# Patient Record
Sex: Male | Born: 1995 | Race: White | Marital: Single | Smoking: Former smoker
Health system: Southern US, Community
[De-identification: ages and names within clinical notes are randomized; demographics above are authoritative.]

---

## 2015-10-18 ENCOUNTER — Encounter: Payer: Self-pay | Admitting: *Deleted

## 2015-10-18 ENCOUNTER — Emergency Department
Admission: EM | Admit: 2015-10-18 | Discharge: 2015-10-18 | Disposition: A | Discharge status: Home / Self Care | Payer: Self-pay | Attending: Emergency Medicine | Admitting: Emergency Medicine

## 2015-10-18 ENCOUNTER — Emergency Department: Payer: Self-pay

## 2015-10-18 DIAGNOSIS — Y92322 Soccer field as the place of occurrence of the external cause: Secondary | ICD-10-CM | POA: Insufficient documentation

## 2015-10-18 DIAGNOSIS — S92351A Displaced fracture of fifth metatarsal bone, right foot, initial encounter for closed fracture: Secondary | ICD-10-CM | POA: Insufficient documentation

## 2015-10-18 DIAGNOSIS — W2102XA Struck by soccer ball, initial encounter: Secondary | ICD-10-CM | POA: Insufficient documentation

## 2015-10-18 DIAGNOSIS — Y9366 Activity, soccer: Secondary | ICD-10-CM | POA: Insufficient documentation

## 2015-10-18 DIAGNOSIS — Y998 Other external cause status: Secondary | ICD-10-CM | POA: Insufficient documentation

## 2015-10-18 DIAGNOSIS — S9031XA Contusion of right foot, initial encounter: Secondary | ICD-10-CM | POA: Insufficient documentation

## 2015-10-18 DIAGNOSIS — S92301A Fracture of unspecified metatarsal bone(s), right foot, initial encounter for closed fracture: Secondary | ICD-10-CM

## 2015-10-18 MED ORDER — TRAMADOL HCL 50 MG PO TABS: 50.0000 mg | ORAL_TABLET | Freq: Four times a day (QID) | ORAL | Status: AC | PRN

## 2015-10-18 MED ORDER — NAPROXEN 500 MG PO TABS: 500.0000 mg | ORAL_TABLET | Freq: Two times a day (BID) | ORAL | Status: AC

## 2015-10-18 NOTE — Discharge Instructions (Signed)
Metatarsal Fracture °A metatarsal fracture is a break in a metatarsal bone. Metatarsal bones connect your toe bones to your ankle bones. °CAUSES °This type of fracture may be caused by: °· A sudden twisting of your foot. °· A fall onto your foot. °· Overuse or repetitive exercise. °RISK FACTORS °This condition is more likely to develop in people who: °· Play contact sports. °· Have a bone disease. °· Have a low calcium level. °SYMPTOMS °Symptoms of this condition include: °· Pain that is worse when walking or standing. °· Pain when pressing on the foot or moving the toes. °· Swelling. °· Bruising on the top or bottom of the foot. °· A foot that appears shorter than the other one. °DIAGNOSIS °This condition is diagnosed with a physical exam. You may also have imaging tests, such as: °· X-rays. °· A CT scan. °· MRI. °TREATMENT °Treatment for this condition depends on its severity and whether a bone has moved out of place. Treatment may involve: °· Rest. °· Wearing foot support such as a cast, splint, or boot for several weeks. °· Using crutches. °· Surgery to move bones back into the right position. Surgery is usually needed if there are many pieces of broken bone or bones that are very out of place (displaced fracture). °· Physical therapy. This may be needed to help you regain full movement and strength in your foot. °You will need to return to your health care provider to have X-rays taken until your bones heal. Your health care provider will look at the X-rays to make sure that your foot is healing well. °HOME CARE INSTRUCTIONS  °If You Have a Cast: °· Do not stick anything inside the cast to scratch your skin. Doing that increases your risk of infection. °· Check the skin around the cast every day. Report any concerns to your health care provider. You may put lotion on dry skin around the edges of the cast. Do not apply lotion to the skin underneath the cast. °· Keep the cast clean and dry. °If You Have a Splint  or a Supportive Boot: °· Wear it as directed by your health care provider. Remove it only as directed by your health care provider. °· Loosen it if your toes become numb and tingle, or if they turn cold and blue. °· Keep it clean and dry. °Bathing °· Do not take baths, swim, or use a hot tub until your health care provider approves. Ask your health care provider if you can take showers. You may only be allowed to take sponge baths for bathing. °· If your health care provider approves bathing and showering, cover the cast or splint with a watertight plastic bag to protect it from water. Do not let the cast or splint get wet. °Managing Pain, Stiffness, and Swelling °· If directed, apply ice to the injured area (if you have a splint, not a cast). °¨ Put ice in a plastic bag. °¨ Place a towel between your skin and the bag. °¨ Leave the ice on for 20 minutes, 2-3 times per day. °· Move your toes often to avoid stiffness and to lessen swelling. °· Raise (elevate) the injured area above the level of your heart while you are sitting or lying down. °Driving °· Do not drive or operate heavy machinery while taking pain medicine. °· Do not drive while wearing foot support on a foot that you use for driving. °Activity °· Return to your normal activities as directed by your health care   provider. Ask your health care provider what activities are safe for you. °· Perform exercises as directed by your health care provider or physical therapist. °Safety °· Do not use the injured foot to support your body weight until your health care provider says that you can. Use crutches as directed by your health care provider. °General Instructions °· Do not put pressure on any part of the cast or splint until it is fully hardened. This may take several hours. °· Do not use any tobacco products, including cigarettes, chewing tobacco, or e-cigarettes. Tobacco can delay bone healing. If you need help quitting, ask your health care  provider. °· Take medicines only as directed by your health care provider. °· Keep all follow-up visits as directed by your health care provider. This is important. °SEEK MEDICAL CARE IF: °· You have a fever. °· Your cast, splint, or boot is too loose or too tight. °· Your cast, splint, or boot is damaged. °· Your pain medicine is not helping. °· You have pain, tingling, or numbness in your foot that is not going away. °SEEK IMMEDIATE MEDICAL CARE IF: °· You have severe pain. °· You have tingling or numbness in your foot that is getting worse. °· Your foot feels cold or becomes numb. °· Your foot changes color. °  °This information is not intended to replace advice given to you by your health care provider. Make sure you discuss any questions you have with your health care provider. °  °Document Released: 09/03/2002 Document Revised: 04/28/2015 Document Reviewed: 10/08/2014 °Elsevier Interactive Patient Education ©2016 [employer] ° °Cast or Splint Care °Casts and splints support injured limbs and keep bones from moving while they heal. It is important to care for your cast or splint at home.   °HOME CARE INSTRUCTIONS °· Keep the cast or splint uncovered during the drying period. It can take 24 to 48 hours to dry if it is made of plaster. A fiberglass cast will dry in less than 1 hour. °· Do not rest the cast on anything harder than a pillow for the first 24 hours. °· Do not put weight on your injured limb or apply pressure to the cast until your health care provider gives you permission. °· Keep the cast or splint dry. Wet casts or splints can lose their shape and may not support the limb as well. A wet cast that has lost its shape can also create harmful pressure on your skin when it dries. Also, wet skin can become infected. °¨ Cover the cast or splint with a plastic bag when bathing or when out in the rain or snow. If the cast is on the trunk of the body, take sponge baths until the cast is removed. °¨ If your  cast does become wet, dry it with a towel or a blow dryer on the cool setting only. °· Keep your cast or splint clean. Soiled casts may be wiped with a moistened cloth. °· Do not place any hard or soft foreign objects under your cast or splint, such as cotton, toilet paper, lotion, or powder. °· Do not try to scratch the skin under the cast with any object. The object could get stuck inside the cast. Also, scratching could lead to an infection. If itching is a problem, use a blow dryer on a cool setting to relieve discomfort. °· Do not trim or cut your cast or remove padding from inside of it. °· Exercise all joints next to the injury that are   not immobilized by the cast or splint. For example, if you have a long leg cast, exercise the hip joint and toes. If you have an arm cast or splint, exercise the shoulder, elbow, thumb, and fingers. °· Elevate your injured arm or leg on 1 or 2 pillows for the first 1 to 3 days to decrease swelling and pain. It is best if you can comfortably elevate your cast so it is higher than your heart. °SEEK MEDICAL CARE IF:  °· Your cast or splint cracks. °· Your cast or splint is too tight or too loose. °· You have unbearable itching inside the cast. °· Your cast becomes wet or develops a soft spot or area. °· You have a bad smell coming from inside your cast. °· You get an object stuck under your cast. °· Your skin around the cast becomes red or raw. °· You have new pain or worsening pain after the cast has been applied. °SEEK IMMEDIATE MEDICAL CARE IF:  °· You have fluid leaking through the cast. °· You are unable to move your fingers or toes. °· You have discolored (blue or white), cool, painful, or very swollen fingers or toes beyond the cast. °· You have tingling or numbness around the injured area. °· You have severe pain or pressure under the cast. °· You have any difficulty with your breathing or have shortness of breath. °· You have chest pain. °  °This information is not  intended to replace advice given to you by your health care provider. Make sure you discuss any questions you have with your health care provider. °  °Document Released: 12/09/2000 Document Revised: 10/02/2013 Document Reviewed: 06/20/2013 °Elsevier Interactive Patient Education ©2016 [employer] ° °

## 2015-10-18 NOTE — ED Provider Notes (Signed)
[facility] Emergency Department Provider Note  ____________________________________________  Time seen: Approximately 4:17 PM  I have reviewed the triage vital signs and the nursing notes.   HISTORY  Chief Complaint Foot Injury    HPI Johnny Mayer is a 19 y.o. male patient complaining of right foot pain secondary to being kicked in the foot playing soccer. Instead occurred 2 days ago. Patient continue having pain to the lateral aspect of his right foot. Patient is noticed bruises last 24 hours. Patient is rating his pain as a 10 over 10 states increase with weightbearing. Palliative measures consist only of ice and Motrin.   History reviewed. No pertinent past medical history.  There are no active problems to display for this patient.   History reviewed. No pertinent past surgical history.  No current outpatient prescriptions on file.  Allergies Review of patient's allergies indicates no known allergies.  No family history on file.  Social History Social History  Substance Use Topics  . Smoking status: Never Smoker   . Smokeless tobacco: None  . Alcohol Use: Yes    Review of Systems Constitutional: No fever/chills Eyes: No visual changes. ENT: No sore throat. Cardiovascular: Denies chest pain. Respiratory: Denies shortness of breath. Gastrointestinal: No abdominal pain.  No nausea, no vomiting.  No diarrhea.  No constipation. Genitourinary: Negative for dysuria. Musculoskeletal: Right lateral foot pain Skin: Negative for rash. Ecchymosis right lateral foot Neurological: Negative for headaches, focal weakness or numbness. 10-point ROS otherwise negative.  ____________________________________________   PHYSICAL EXAM:  VITAL SIGNS: ED Triage Vitals  Enc Vitals Group     BP 10/18/15 1612 114/6 mmHg     Pulse Rate 10/18/15 1612 50     Resp 10/18/15 1612 16     Temp 10/18/15 1612 97.6 F (36.4 C)     Temp Source 10/18/15 1612  Oral     SpO2 10/18/15 1612 98 %     Weight 10/18/15 1612 165 lb (74.844 kg)     Height 10/18/15 1612 6\' 2"  (1.88 m)     Head Cir --      Peak Flow --      Pain Score 10/18/15 1605 10     Pain Loc --      Pain Edu? --      Excl. in GC? --     Constitutional: Alert and oriented. Well appearing and in no acute distress. Eyes: Conjunctivae are normal. PERRL. EOMI. Head: Atraumatic. Nose: No congestion/rhinnorhea. Mouth/Throat: Mucous membranes are moist.  Oropharynx non-erythematous. Neck: No stridor.  No cervical spine tenderness to palpation. Hematological/Lymphatic/Immunilogical: No cervical lymphadenopathy. Cardiovascular: Normal rate, regular rhythm. Grossly normal heart sounds.  Good peripheral circulation. Respiratory: Normal respiratory effort.  No retractions. Lungs CTAB. Gastrointestinal: Soft and nontender. No distention. No abdominal bruits. No CVA tenderness. Musculoskeletal: No obvious deformity. Mild edema and ecchymosis lateral fifth metatarsal. Free nuchal range of motion nonweightbearing. Patient has atypical gait with walking favoring the injured lower extremity.  Neurologic:  Normal speech and language. No gross focal neurologic deficits are appreciated. No gait instability. Skin:  Skin is warm, dry and intact. No rash noted. Psychiatric: Mood and affect are normal. Speech and behavior are normal.  ____________________________________________   LABS (all labs ordered are listed, but only abnormal results are displayed)  Labs Reviewed - No data to display ____________________________________________  EKG   ____________________________________________  RADIOLOGY  X-rays reveal mild displaced fracture of the base of the fifth metatarsal.   I, Joni Reiningonald K Joane Postel, personally  viewed and evaluated these images (plain radiographs) as part of my medical decision making.   ____________________________________________   PROCEDURES  Procedure(s) performed:  None  Critical Care performed: No  ____________________________________________   INITIAL IMPRESSION / ASSESSMENT AND PLAN / ED COURSE  Pertinent labs & imaging results that were available during my care of the patient were reviewed by me and considered in my medical decision making (see chart for details).  Fifth metatarsal fracture right foot. Skin is x-ray findings with patient. Patient placed in a walking shoe and advised to follow orthopedics. Patient given a prescription for naproxen and tramadol. ____________________________________________   FINAL CLINICAL IMPRESSION(S) / ED DIAGNOSES  Final diagnoses:  Fracture of fifth metatarsal bone, right, closed, initial encounter      Joni Reining, PA-C 10/18/15 1704  Darien Ramus, MD 10/18/15 [phone]

## 2015-10-18 NOTE — ED Notes (Signed)
Pt was playing soccer and he was kicked in his right foot.

## 2017-03-25 ENCOUNTER — Emergency Department
Admission: EM | Admit: 2017-03-25 | Discharge: 2017-03-25 | Disposition: A | Discharge status: Home / Self Care | Payer: Managed Care, Other (non HMO) | Attending: Emergency Medicine | Admitting: Emergency Medicine

## 2017-03-25 ENCOUNTER — Emergency Department: Payer: Managed Care, Other (non HMO)

## 2017-03-25 ENCOUNTER — Encounter: Payer: Self-pay | Admitting: Emergency Medicine

## 2017-03-25 DIAGNOSIS — Y939 Activity, unspecified: Secondary | ICD-10-CM | POA: Insufficient documentation

## 2017-03-25 DIAGNOSIS — W010XXA Fall on same level from slipping, tripping and stumbling without subsequent striking against object, initial encounter: Secondary | ICD-10-CM | POA: Insufficient documentation

## 2017-03-25 DIAGNOSIS — S52502A Unspecified fracture of the lower end of left radius, initial encounter for closed fracture: Secondary | ICD-10-CM | POA: Insufficient documentation

## 2017-03-25 DIAGNOSIS — Y999 Unspecified external cause status: Secondary | ICD-10-CM | POA: Diagnosis not present

## 2017-03-25 DIAGNOSIS — Z87891 Personal history of nicotine dependence: Secondary | ICD-10-CM | POA: Insufficient documentation

## 2017-03-25 DIAGNOSIS — Y929 Unspecified place or not applicable: Secondary | ICD-10-CM | POA: Diagnosis not present

## 2017-03-25 DIAGNOSIS — S6992XA Unspecified injury of left wrist, hand and finger(s), initial encounter: Secondary | ICD-10-CM | POA: Diagnosis present

## 2017-03-25 DIAGNOSIS — S62102A Fracture of unspecified carpal bone, left wrist, initial encounter for closed fracture: Secondary | ICD-10-CM

## 2017-03-25 MED ORDER — OXYCODONE-ACETAMINOPHEN 5-325 MG PO TABS: 1.0000 | ORAL_TABLET | ORAL | 0 refills | Status: AC | PRN

## 2017-03-25 MED ORDER — OXYCODONE-ACETAMINOPHEN 5-325 MG PO TABS
ORAL_TABLET | ORAL | Status: AC
  Administered 2017-03-25: 1 via ORAL
  Filled 2017-03-25: qty 1

## 2017-03-25 MED ORDER — OXYCODONE-ACETAMINOPHEN 5-325 MG PO TABS
1.0000 | ORAL_TABLET | Freq: Once | ORAL | Status: AC
  Administered 2017-03-25: 1 via ORAL

## 2017-03-25 NOTE — Discharge Instructions (Signed)
Please seek medical attention for any high fevers, chest pain, shortness of breath, change in behavior, persistent vomiting, bloody stool or any other new or concerning symptoms.  

## 2017-03-25 NOTE — ED Triage Notes (Signed)
Patient states that he slipped and fell. Patient with pain to left wrist.

## 2017-03-25 NOTE — ED Provider Notes (Signed)
[facility] Emergency Department Provider Note   ____________________________________________   I have reviewed the triage vital signs and the nursing notes.   HISTORY  Chief Complaint Wrist Pain   History limited by: Not Limited   HPI Johnny Mayer is a 21 y.o. male who presents to the emergency department today because of concerns for left wrist pain. Patient states he slipped and fell back onto his buttocks. When it happened he put his hand down. He denies pain anywhere else. He states he feels somewhat decreased sensation in his fingertips. He denies hitting his head.   History reviewed. No pertinent past medical history.  There are no active problems to display for this patient.   History reviewed. No pertinent surgical history.  Prior to Admission medications   Medication Sig Start Date End Date Taking? Authorizing Provider  naproxen (NAPROSYN) 500 MG tablet Take 1 tablet (500 mg total) by mouth 2 (two) times daily with a meal. 10/18/15   Joni Reining, PA-C  traMADol (ULTRAM) 50 MG tablet Take 1 tablet (50 mg total) by mouth every 6 (six) hours as needed for moderate pain. 10/18/15   Joni Reining, PA-C    Allergies Patient has no known allergies.  No family history on file.  Social History Social History  Substance Use Topics  . Smoking status: Former Games developer  . Smokeless tobacco: Never Used  . Alcohol use Yes    Review of Systems  Constitutional: Negative for fever. Cardiovascular: Negative for chest pain. Respiratory: Negative for shortness of breath. Gastrointestinal: Negative for abdominal pain, vomiting and diarrhea. Genitourinary: Negative for dysuria. Musculoskeletal: Positive for left wrist pain. Neurological: Negative for headaches, focal weakness or numbness.  10-point ROS otherwise negative.  ____________________________________________   PHYSICAL EXAM:  VITAL SIGNS: ED Triage Vitals  Enc Vitals Group      BP --      Pulse --      Resp --      Temp --      Temp src --      SpO2 --      Weight 03/25/17 0242 160 lb (72.6 kg)     Height 03/25/17 0242  (1.88 m)     Head Circumference --      Peak Flow --      Pain Score 03/25/17 0240 9   Constitutional: Alert and oriented. Well appearing and in no distress. Eyes: Conjunctivae are normal. Normal extraocular movements. ENT   Head: Normocephalic and atraumatic.   Nose: No congestion/rhinnorhea.   Mouth/Throat: Mucous membranes are moist.   Neck: No stridor. Hematological/Lymphatic/Immunilogical: No cervical lymphadenopathy. Cardiovascular: Normal rate, regular rhythm.  No murmurs, rubs, or gallops.  Respiratory: Normal respiratory effort without tachypnea nor retractions. Breath sounds are clear and equal bilaterally. No wheezes/rales/rhonchi. Genitourinary: Deferred Musculoskeletal: Left wrist without obvious deformity. Tender to palpation over the distal radius and tenderness with grip testing. Sensation intact over all finger pads. Cap refill < 3 secs.  Neurologic:  Normal speech and language. No gross focal neurologic deficits are appreciated.  Skin:  Skin is warm, dry and intact. No rash noted. Psychiatric: Mood and affect are normal. Speech and behavior are normal. Patient exhibits appropriate insight and judgment.  ____________________________________________    LABS (pertinent positives/negatives)  None  ____________________________________________   EKG  None  ____________________________________________    RADIOLOGY  Left wrist IMPRESSION:  Acute nondisplaced distal radial fracture.     I, Phineas Semen, personally viewed and evaluated these images (  plain radiographs) as part of my medical decision making, as well as reviewing the written report by the  radiologist.  ____________________________________________   PROCEDURES  Procedures  ____________________________________________   INITIAL IMPRESSION / ASSESSMENT AND PLAN / ED COURSE  Pertinent labs & imaging results that were available during my care of the patient were reviewed by me and considered in my medical decision making (see chart for details).  Patient presented to the emergency department today with concerns for left wrist pain after a fall. X-rays do show a distal left radial fracture. Patient was placed in a splint. Patient will be given orthopedic follow-up. [place] drug database was checked prior to prescribing opioid pain medication.  ____________________________________________   FINAL CLINICAL IMPRESSION(S) / ED DIAGNOSES  Final diagnoses:  Closed fracture of left wrist, initial encounter  Closed fracture of distal end of left radius, unspecified fracture morphology, initial encounter     Note: This dictation was prepared with Dragon dictation. Any transcriptional errors that result from this process are unintentional     Phineas Semen, MD 03/25/17 [phone]

## 2017-05-22 IMAGING — DX DG WRIST COMPLETE 3+V*L*
4 series · 4 of 4 positions shown · non-contrast
Comparison: None.

CLINICAL DATA: Slip and fall, LEFT wrist pain.

EXAM:
LEFT WRIST - COMPLETE 3+ VIEW

[wrist ap (1 of 2)]
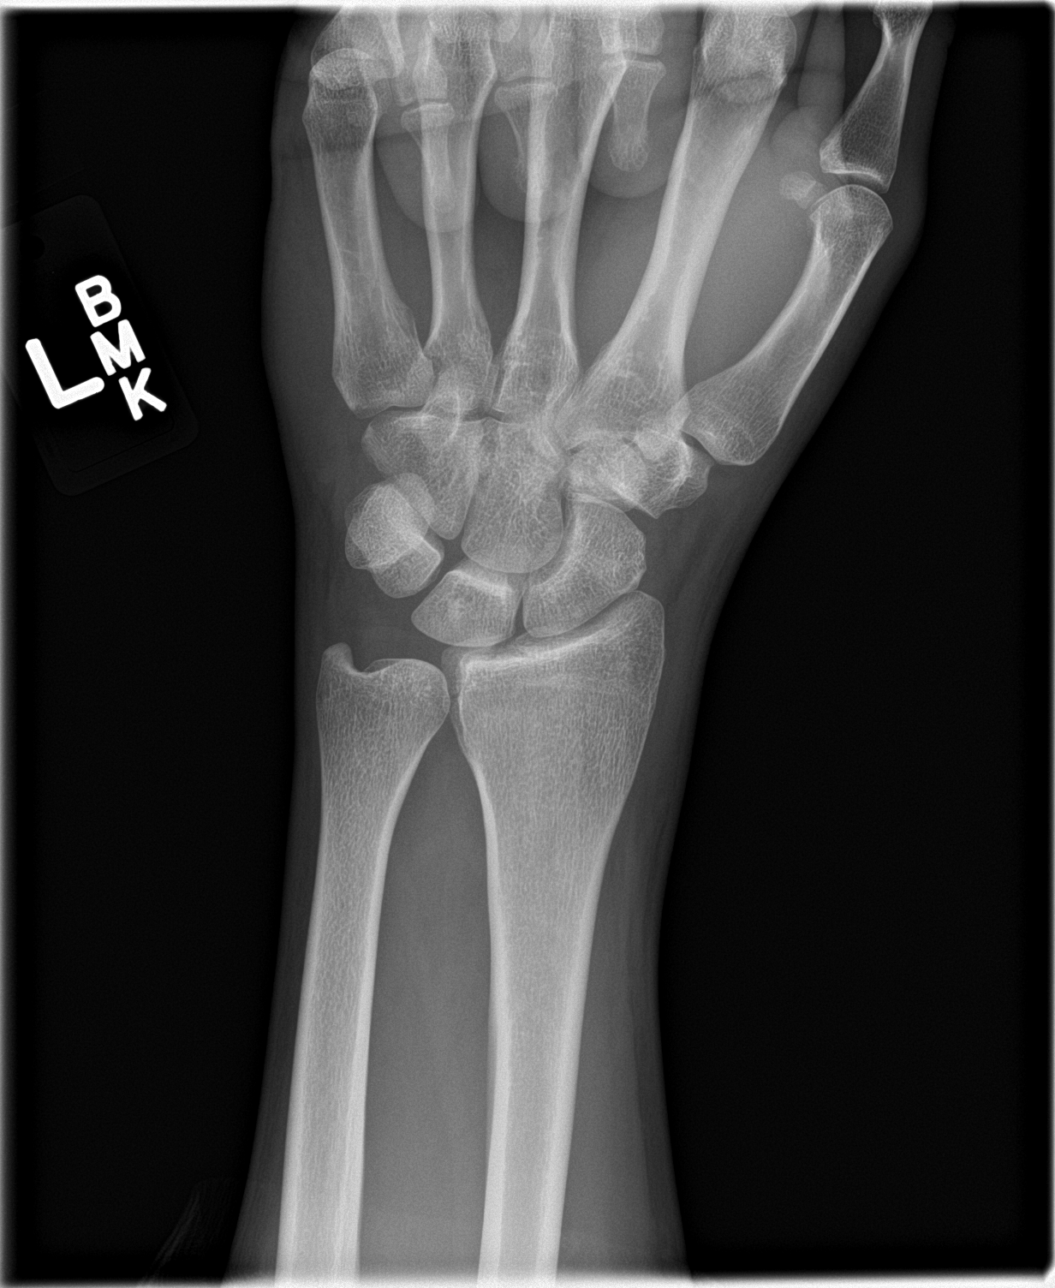

[wrist obl]
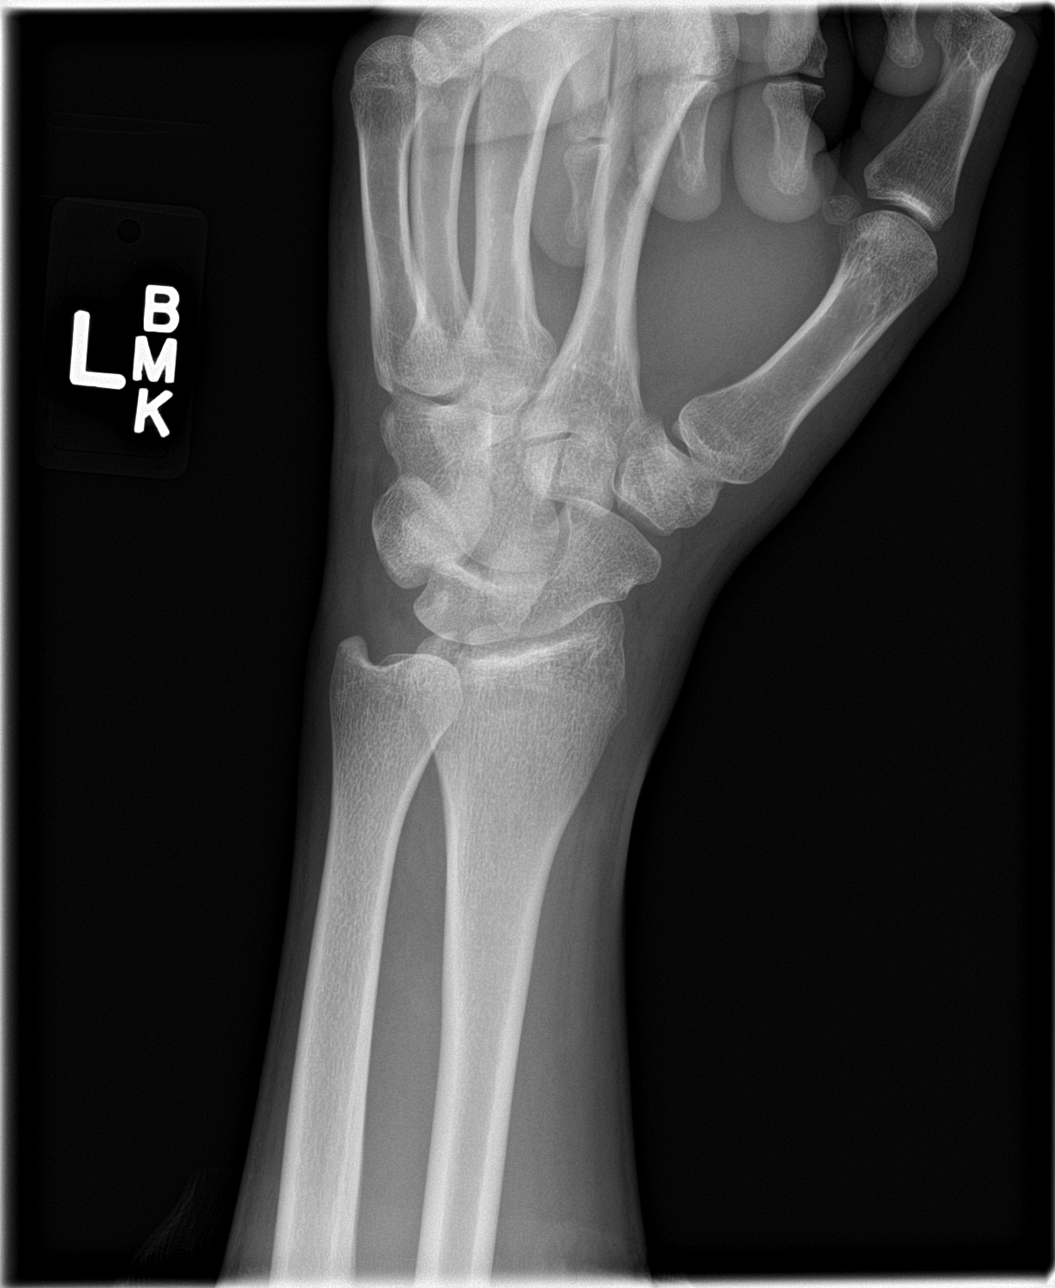

[wrist lat]
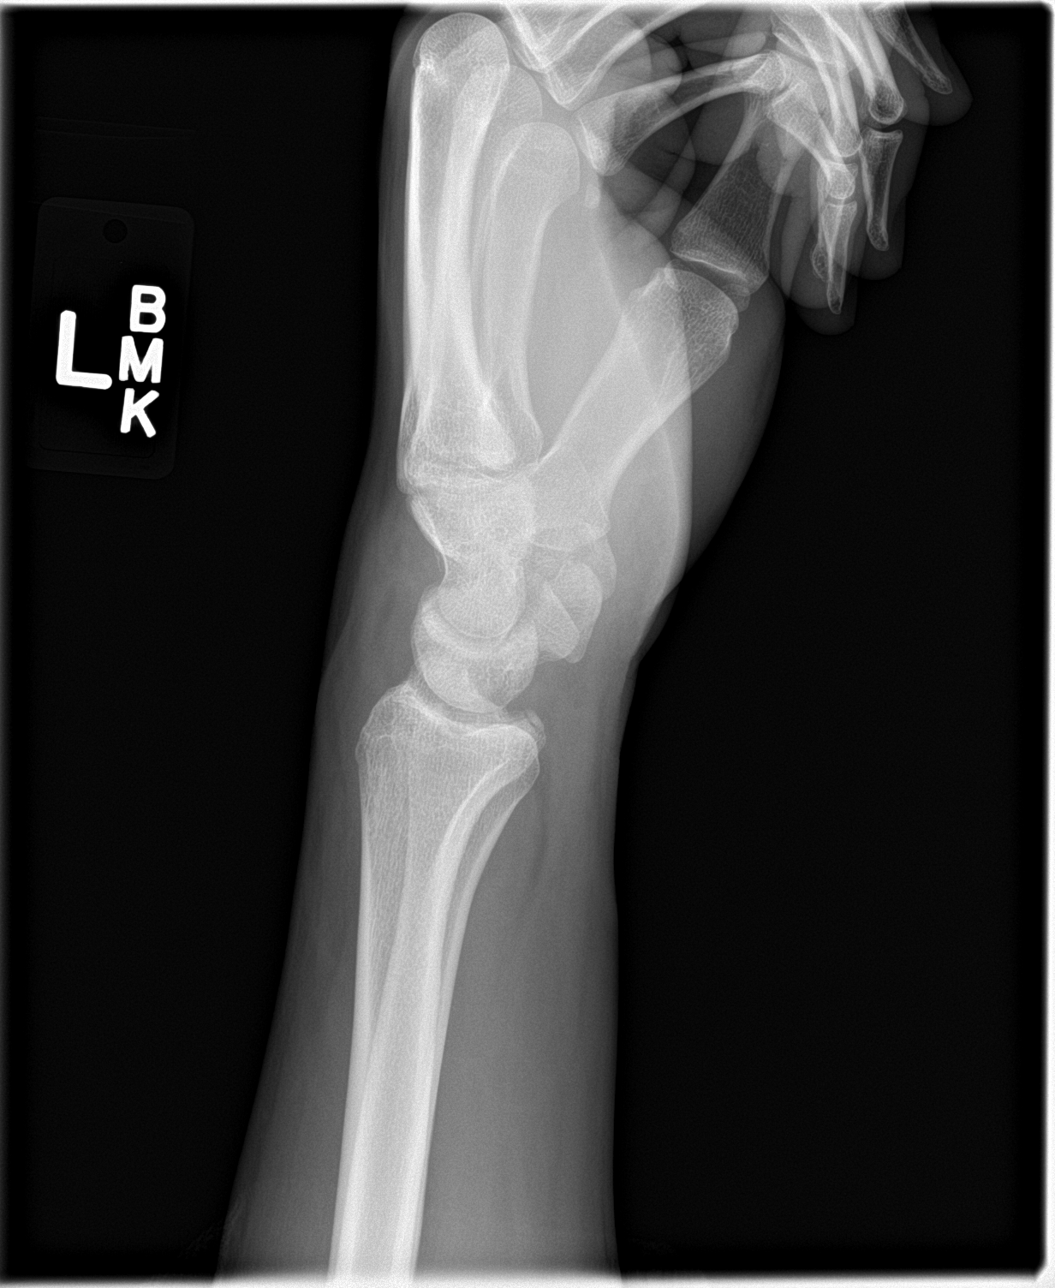

[wrist ap (2 of 2)]
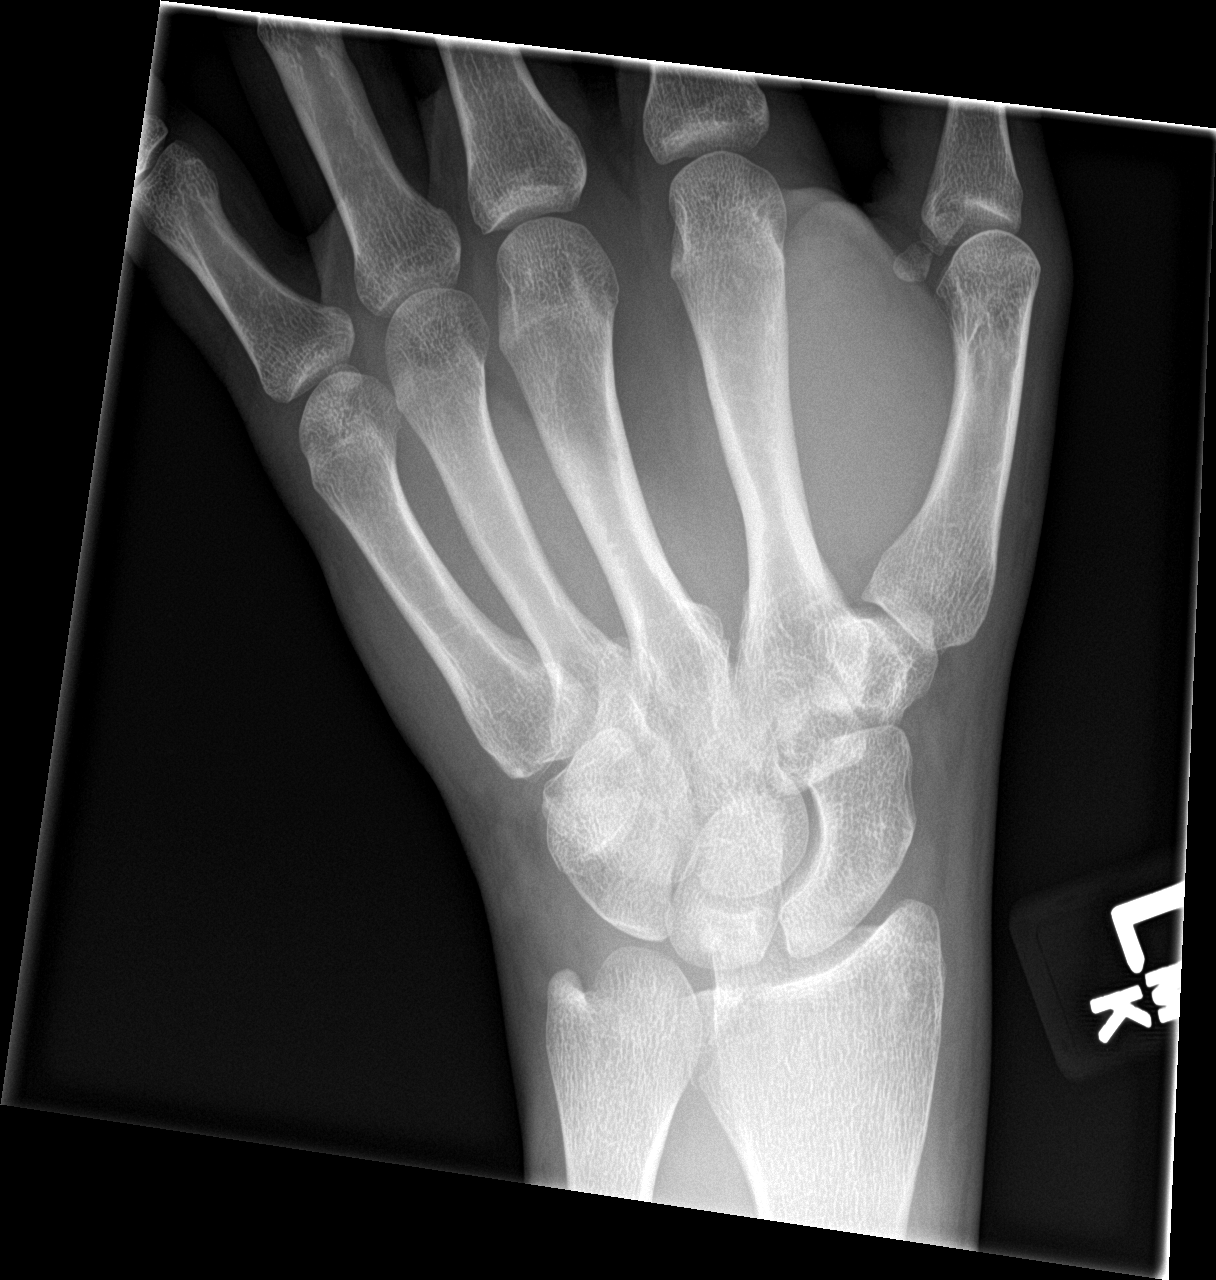

[4 of 4 positions shown; findings below may reference images not displayed]

FINDINGS: Oblique nondisplaced fracture through medial distal radius with
intra-articular extension. No dislocation. No destructive bony
lesions. Mild dorsal wrist soft tissue swelling without subcutaneous
gas or radiopaque foreign bodies.
IMPRESSION: Acute nondisplaced distal radial fracture.
